# Patient Record
Sex: Female | Born: 1990 | Race: White | Hispanic: No | Marital: Single | Smoking: Former smoker
Health system: Southern US, Community
[De-identification: ages and names within clinical notes are randomized; demographics above are authoritative.]

## PROBLEM LIST (undated history)

## (undated) DIAGNOSIS — L659 Nonscarring hair loss, unspecified: Secondary | ICD-10-CM

---

## 2010-08-01 ENCOUNTER — Emergency Department (HOSPITAL_COMMUNITY): Payer: No Typology Code available for payment source

## 2010-08-01 ENCOUNTER — Emergency Department (HOSPITAL_COMMUNITY)
Admission: EM | Admit: 2010-08-01 | Discharge: 2010-08-01 | Disposition: A | Discharge status: Home / Self Care | Payer: No Typology Code available for payment source | Attending: Emergency Medicine | Admitting: Emergency Medicine

## 2010-08-01 DIAGNOSIS — M545 Low back pain, unspecified: Secondary | ICD-10-CM | POA: Insufficient documentation

## 2010-08-01 DIAGNOSIS — R109 Unspecified abdominal pain: Secondary | ICD-10-CM | POA: Insufficient documentation

## 2010-08-01 DIAGNOSIS — N949 Unspecified condition associated with female genital organs and menstrual cycle: Secondary | ICD-10-CM | POA: Insufficient documentation

## 2010-08-01 DIAGNOSIS — N938 Other specified abnormal uterine and vaginal bleeding: Secondary | ICD-10-CM | POA: Insufficient documentation

## 2010-08-01 DIAGNOSIS — L659 Nonscarring hair loss, unspecified: Secondary | ICD-10-CM | POA: Insufficient documentation

## 2010-08-01 LAB — URINALYSIS, ROUTINE W REFLEX MICROSCOPIC
Bilirubin Urine: NEGATIVE
Glucose, UA: NEGATIVE mg/dL
Ketones, ur: NEGATIVE mg/dL
Protein, ur: NEGATIVE mg/dL
Urobilinogen, UA: 0.2 mg/dL (ref 0.0–1.0)

## 2010-08-01 LAB — DIFFERENTIAL
Basophils Relative: 0 % (ref 0–1)
Lymphocytes Relative: 17 % (ref 12–46)
Monocytes Absolute: 0.7 10*3/uL (ref 0.1–1.0)
Monocytes Relative: 6 % (ref 3–12)
Neutro Abs: 8.7 10*3/uL — ABNORMAL HIGH (ref 1.7–7.7)
Neutrophils Relative %: 76 % (ref 43–77)

## 2010-08-01 LAB — CBC
HCT: 43 % (ref 36.0–46.0)
Hemoglobin: 14.6 g/dL (ref 12.0–15.0)
MCH: 27.5 pg (ref 26.0–34.0)
MCHC: 34 g/dL (ref 30.0–36.0)
RBC: 5.3 MIL/uL — ABNORMAL HIGH (ref 3.87–5.11)

## 2010-08-01 LAB — URINE MICROSCOPIC-ADD ON

## 2012-04-30 IMAGING — US US PELVIS COMPLETE
1 series · 14 of 25 positions shown · non-contrast
Comparison: None.

CLINICAL DATA: Pelvic pain, evaluate for fibroids or endometriosis.



[Series 1: us pelvis complete · 0.21mm/px · 14 of 62 slices shown]
[im 1/62]
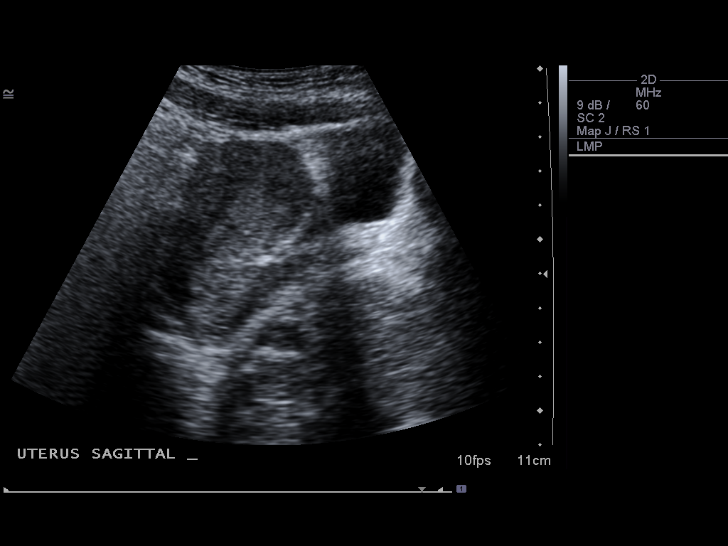
[im 6/62]
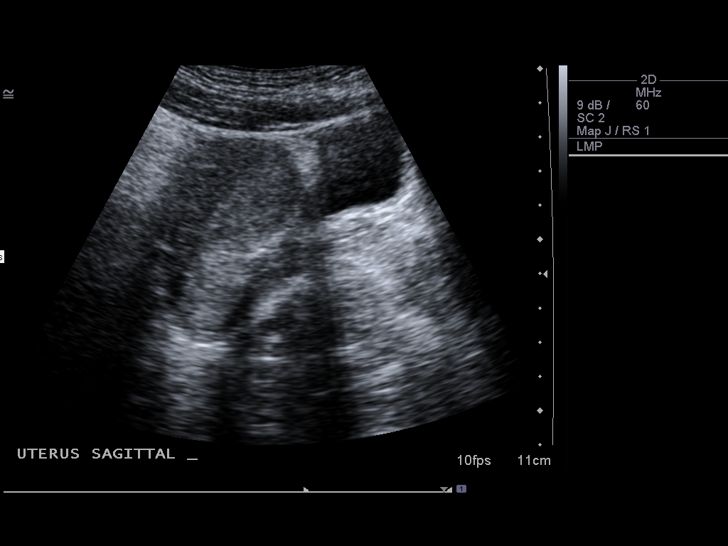
[im 11/62]
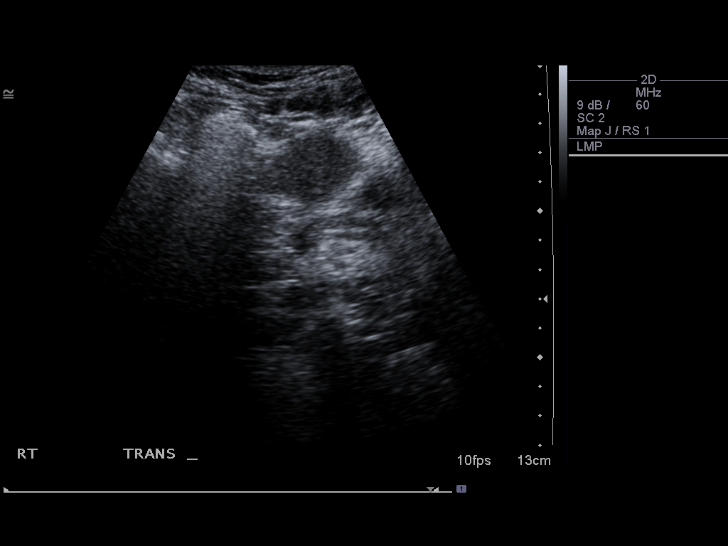
[im 16/62]
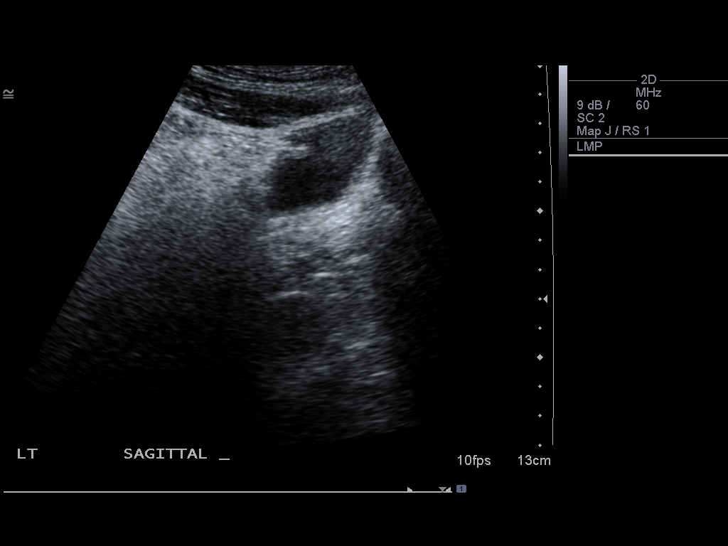
[im 21/62]
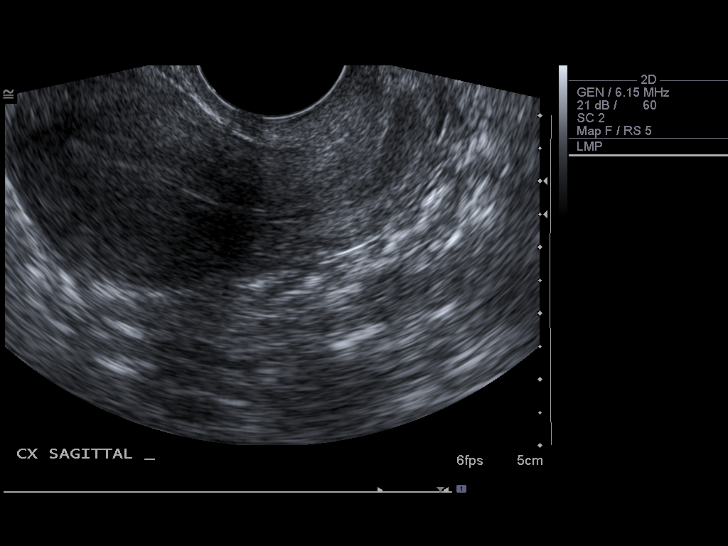
[im 23/62]
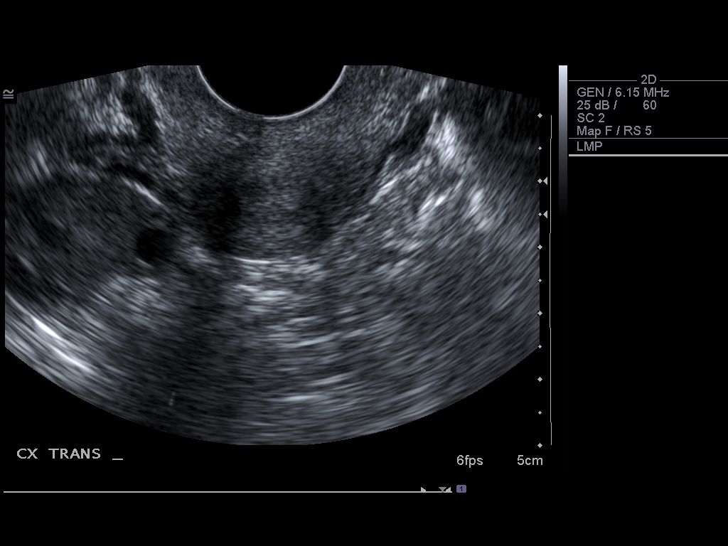
[im 28/62]
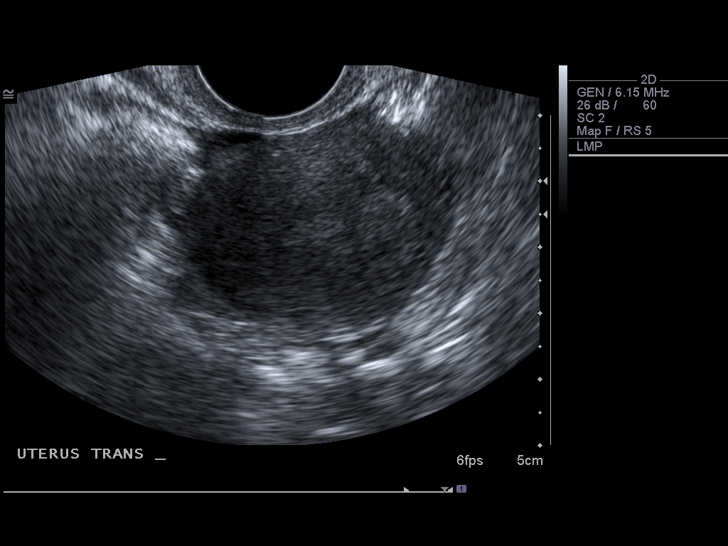
[im 34/62]
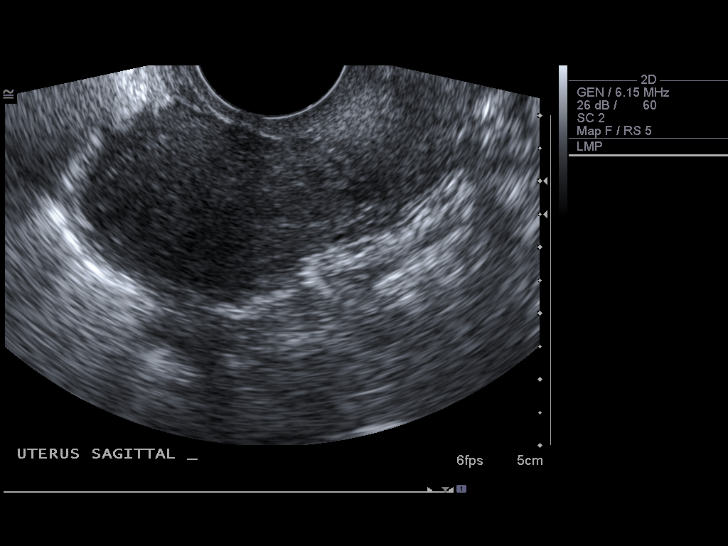
[im 39/62]
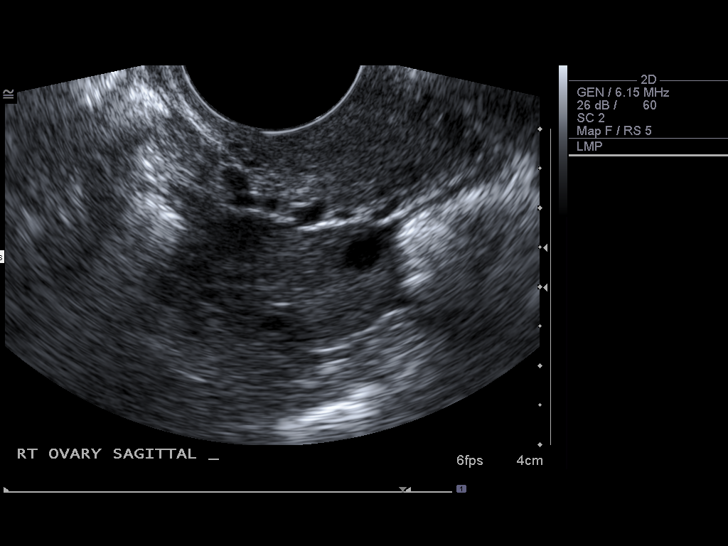
[im 41/62]
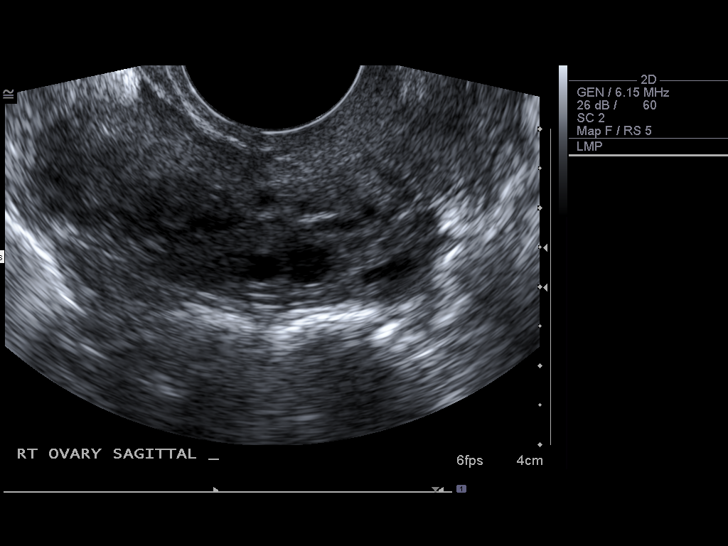
[im 46/62]
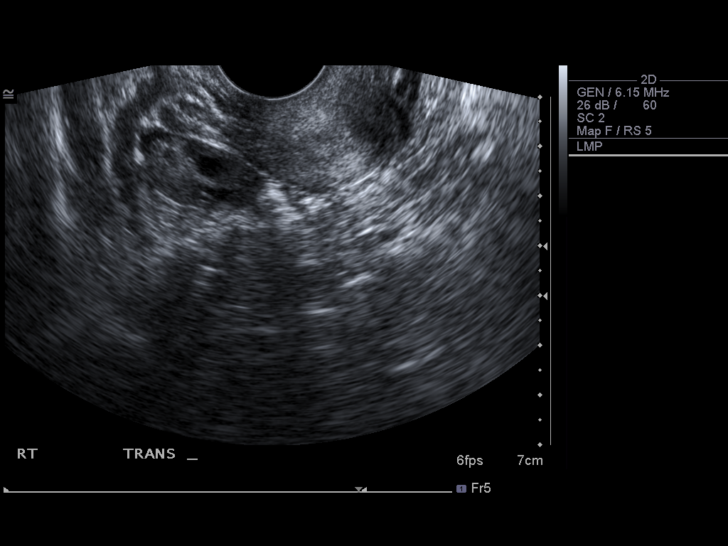
[im 51/62]
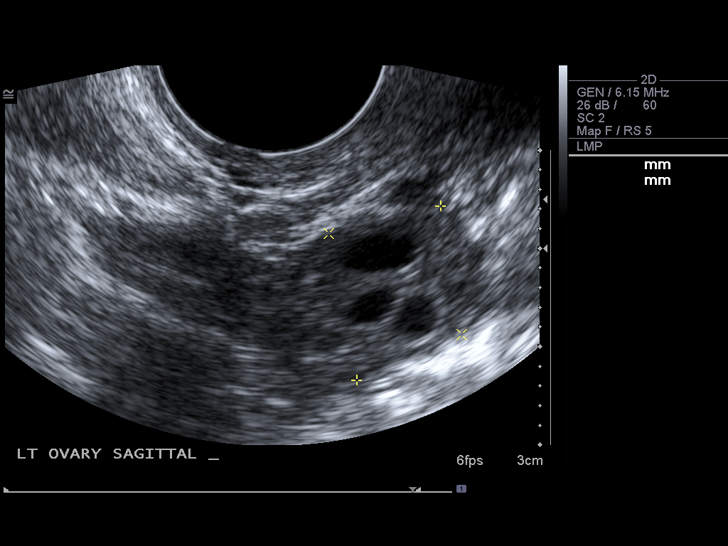
[im 56/62]
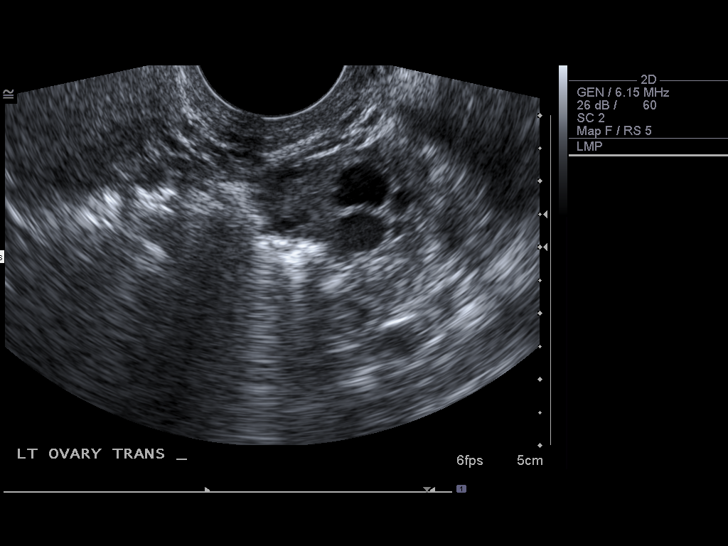
[im 62/62]
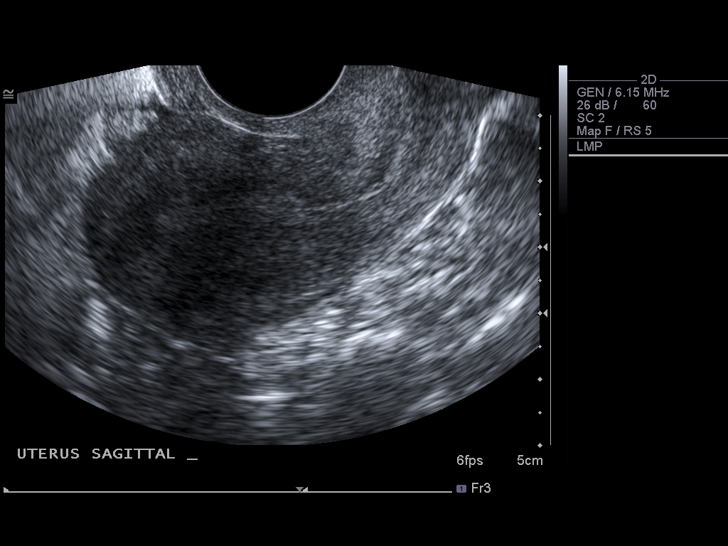

[14 of 25 positions shown; findings below may reference images not displayed]

FINDINGS: Uterus:  The uterus measures 6.2 x 3.3 x 4.3 cm.  No fibroids or
other uterine masses identified.

Endometrium:  The endometrium measures 4.5 mm.  Normal in
appearance.

Right ovary:  3.1 x 1.5 x 1.7 cm.  Normal appearance/no adnexal
mass.

Left ovary:  2.0 x 1.7 x 1.6 cm.  Normal appearance/no adnexal
mass.

Other findings:  A trace amount of free fluid is noted within the
pelvis.
IMPRESSION: Normal study.  No evidence of pelvic mass or other significant
abnormality.

## 2012-10-12 ENCOUNTER — Emergency Department (HOSPITAL_COMMUNITY)
Admission: EM | Admit: 2012-10-12 | Discharge: 2012-10-12 | Disposition: A | Discharge status: Home / Self Care | Payer: No Typology Code available for payment source | Attending: Emergency Medicine | Admitting: Emergency Medicine

## 2012-10-12 ENCOUNTER — Encounter (HOSPITAL_COMMUNITY): Payer: Self-pay | Admitting: Emergency Medicine

## 2012-10-12 DIAGNOSIS — B029 Zoster without complications: Secondary | ICD-10-CM | POA: Insufficient documentation

## 2012-10-12 DIAGNOSIS — R110 Nausea: Secondary | ICD-10-CM | POA: Insufficient documentation

## 2012-10-12 DIAGNOSIS — Z87891 Personal history of nicotine dependence: Secondary | ICD-10-CM | POA: Insufficient documentation

## 2012-10-12 DIAGNOSIS — Z872 Personal history of diseases of the skin and subcutaneous tissue: Secondary | ICD-10-CM | POA: Insufficient documentation

## 2012-10-12 HISTORY — DX: Nonscarring hair loss, unspecified: L65.9

## 2012-10-12 MED ORDER — ACYCLOVIR 400 MG PO TABS: 400.0000 mg | ORAL_TABLET | Freq: Four times a day (QID) | ORAL | Status: AC

## 2012-10-12 MED ORDER — OXYCODONE-ACETAMINOPHEN 5-325 MG PO TABS: 1.0000 | ORAL_TABLET | Freq: Four times a day (QID) | ORAL | Status: AC | PRN

## 2012-10-12 NOTE — ED Notes (Signed)
PT. REPORTS ITCHY/PAINFUL RASH AT RIGHT LOWER BACK FOR 3 DAYS .

## 2012-10-12 NOTE — ED Provider Notes (Signed)
History    This chart was scribed for non-physician practitioner Magnus Sinning, PA-C working with Doug Sou, MD by Toya Smothers, ED Scribe. This patient was seen in room TR08C/TR08C and the patient's care was started at 9:56 PM.   CSN: [id number]  Arrival date & time 10/12/12  1932   First MD Initiated Contact with Patient 10/12/12 2041      Chief Complaint  Patient presents with  . Rash    Patient is a 22 y.o. female presenting with rash. The history is provided by the patient. No language interpreter was used.  Rash Associated symptoms: nausea     HPI Comments: April Mcknight is a 22 y.o. female with h/o alopecia, who presents to the Emergency Department complaining of 3 days of new, sudden onset, constant rash to the left lower back.  Rash is painful.  No drainage from the rash.  Pt denies exposure to new chemicals, foods, or detergents. Denies new medications.  Symptoms have not been treated PTA. Pt denies headache, diaphoresis, fever, chills, nausea, vomiting, diarrhea, weakness, cough, SOB and any other pain. Pt denies use of tobacco, alcohol, and illicit drug use.     Past Medical History  Diagnosis Date  . Alopecia     History reviewed. No pertinent past surgical history.  No family history on file.  History  Substance Use Topics  . Smoking status: Former Games developer  . Smokeless tobacco: Not on file  . Alcohol Use: No     Review of Systems  Gastrointestinal: Positive for nausea.  Skin: Positive for rash.  All other systems reviewed and are negative.    Allergies  Other  Home Medications   Current Outpatient Rx  Name  Route  Sig  Dispense  Refill  . Ibuprofen (IBU PO)   Oral   Take 2 tablets by mouth every 6 (six) hours as needed (pain).           BP 133/75  Pulse 90  Temp(Src) 98.1 F (36.7 C) (Oral)  Resp 16  SpO2 100%  LMP 09/24/2012  Physical Exam  Nursing note and vitals reviewed. Constitutional: She is oriented to person,  place, and time. She appears well-developed and well-nourished. No distress.  HENT:  Head: Normocephalic and atraumatic.  Eyes: EOM are normal.  Neck: Neck supple. No tracheal deviation present.  Cardiovascular: Normal rate.   Pulmonary/Chest: Effort normal. No respiratory distress.  Musculoskeletal: Normal range of motion.  Neurological: She is alert and oriented to person, place, and time.  Skin: Skin is warm and dry.  Erythematous vesicular rash located on the right side of the lower lumbar No lesions on the left side  Psychiatric: She has a normal mood and affect. Her behavior is normal.    ED Course  Procedures DIAGNOSTIC STUDIES: Oxygen Saturation is 100% on room air, normal by my interpretation.    COORDINATION OF CARE: 21:56- Evaluated Pt. Pt is awake, alert, and without distress. 22:01- Patient understands and agrees with initial ED impression and plan with expectations set for ED visit.   Labs Reviewed - No data to display No results found.   No diagnosis found.    MDM  Appearance of the painful rash consistent with Shingles.  Patient given prescription for Acycylovir and pain medication.  Patient instructed to follow up with PCP.  I personally performed the services described in this documentation, which was scribed in my presence. The recorded information has been reviewed and is accurate.    April Mcknight  [name], PA-C 10/13/12 1700

## 2012-10-14 NOTE — ED Provider Notes (Signed)
Medical screening examination/treatment/procedure(s) were performed by non-physician practitioner and as supervising physician I was immediately available for consultation/collaboration.  Leonard Hendler, MD 10/14/12 0133 

## 2014-06-24 ENCOUNTER — Emergency Department (HOSPITAL_COMMUNITY)
Admission: EM | Admit: 2014-06-24 | Discharge: 2014-06-24 | Disposition: A | Discharge status: Home / Self Care | Payer: No Typology Code available for payment source | Attending: Emergency Medicine | Admitting: Emergency Medicine

## 2014-06-24 ENCOUNTER — Encounter (HOSPITAL_COMMUNITY): Payer: Self-pay | Admitting: *Deleted

## 2014-06-24 DIAGNOSIS — R21 Rash and other nonspecific skin eruption: Secondary | ICD-10-CM | POA: Diagnosis present

## 2014-06-24 DIAGNOSIS — Z87891 Personal history of nicotine dependence: Secondary | ICD-10-CM | POA: Insufficient documentation

## 2014-06-24 DIAGNOSIS — Z79899 Other long term (current) drug therapy: Secondary | ICD-10-CM | POA: Insufficient documentation

## 2014-06-24 DIAGNOSIS — L509 Urticaria, unspecified: Secondary | ICD-10-CM | POA: Insufficient documentation

## 2014-06-24 MED ORDER — DIPHENHYDRAMINE HCL 25 MG PO CAPS
50.0000 mg | ORAL_CAPSULE | Freq: Once | ORAL | Status: AC
  Administered 2014-06-24: 50 mg via ORAL
  Filled 2014-06-24: qty 2

## 2014-06-24 MED ORDER — DIPHENHYDRAMINE HCL 25 MG PO TABS: 25.0000 mg | ORAL_TABLET | ORAL | Status: AC | PRN

## 2014-06-24 NOTE — Discharge Instructions (Signed)
Avoid soaps and detergents with dye and perfume. Take Benadryl as needed for itching. Return immediately for oral swelling, difficulty breathing, wheezing, persistent vomiting or for any concerns.  Hives Hives are itchy, red, swollen areas of the skin. They can vary in size and location on your body. Hives can come and go for hours or several days (acute hives) or for several weeks (chronic hives). Hives do not spread from person to person (noncontagious). They may get worse with scratching, exercise, and emotional stress. CAUSES   Allergic reaction to food, additives, or drugs.  Infections, including the common cold.  Illness, such as vasculitis, lupus, or thyroid disease.  Exposure to sunlight, heat, or cold.  Exercise.  Stress.  Contact with chemicals. SYMPTOMS   Red or white swollen patches on the skin. The patches may change size, shape, and location quickly and repeatedly.  Itching.  Swelling of the hands, feet, and face. This may occur if hives develop deeper in the skin. DIAGNOSIS  Your caregiver can usually tell what is wrong by performing a physical exam. Skin or blood tests may also be done to determine the cause of your hives. In some cases, the cause cannot be determined. TREATMENT  Mild cases usually get better with medicines such as antihistamines. Severe cases may require an emergency epinephrine injection. If the cause of your hives is known, treatment includes avoiding that trigger.  HOME CARE INSTRUCTIONS   Avoid causes that trigger your hives.  Take antihistamines as directed by your caregiver to reduce the severity of your hives. Non-sedating or low-sedating antihistamines are usually recommended. Do not drive while taking an antihistamine.  Take any other medicines prescribed for itching as directed by your caregiver.  Wear loose-fitting clothing.  Keep all follow-up appointments as directed by your caregiver. SEEK MEDICAL CARE IF:   You have  persistent or severe itching that is not relieved with medicine.  You have painful or swollen joints. SEEK IMMEDIATE MEDICAL CARE IF:   You have a fever.  Your tongue or lips are swollen.  You have trouble breathing or swallowing.  You feel tightness in the throat or chest.  You have abdominal pain. These problems may be the first sign of a life-threatening allergic reaction. Call your local emergency services (911 in [place] MAKE SURE YOU:   Understand these instructions.  Will watch your condition.  Will get help right away if you are not doing well or get worse. Document Released: 04/18/2005 Document Revised: 04/23/2013 Document Reviewed: 07/12/2011 [facility] Patient Information 2015 [place], [place]. This information is not intended to replace advice given to you by your health care provider. Make sure you discuss any questions you have with your health care provider.

## 2014-06-24 NOTE — ED Notes (Signed)
Patient presents with rash on ext and trunk.  States no new foods, soaps, etc

## 2014-06-24 NOTE — ED Provider Notes (Signed)
CSN: [id number]     Arrival date & time 06/24/14  0157 History   First MD Initiated Contact with Patient 06/24/14 [phone]     Chief Complaint  Patient presents with  . Rash     (Consider location/radiation/quality/duration/timing/severity/associated sxs/prior Treatment) HPI Patient with raised erythematous rash to legs and trunk starting around 8 PM last night. Denies any new medication. States she started using Ax body wash several days ago. She has a history of sensitive skin. No oral swelling. No respiratory difficulty. No nausea or vomiting. Patient has had no medication this point. No family members with similar rashes. Past Medical History  Diagnosis Date  . Alopecia    History reviewed. No pertinent past surgical history. No family history on file. History  Substance Use Topics  . Smoking status: Former Games developermoker  . Smokeless tobacco: Never Used  . Alcohol Use: No   OB History    Gravida Para Term Preterm AB TAB SAB Ectopic Multiple Living   1              Review of Systems  Constitutional: Negative for fever and chills.  HENT: Negative for facial swelling.   Respiratory: Negative for chest tightness.   Cardiovascular: Negative for chest pain.  Gastrointestinal: Negative for nausea and vomiting.  Skin: Positive for rash.  Neurological: Negative for dizziness, weakness, light-headedness, numbness and headaches.  All other systems reviewed and are negative.     Allergies  Other  Home Medications   Prior to Admission medications   Medication Sig Start Date End Date Taking? Authorizing Provider  acyclovir (ZOVIRAX) 400 MG tablet Take 1 tablet (400 mg total) by mouth 4 (four) times daily. 10/12/12   Heather Laisure, PA-C  diphenhydrAMINE (BENADRYL) 25 MG tablet Take 1 tablet (25 mg total) by mouth every 4 (four) hours as needed for itching. 06/24/14   Loren Raceravid Momoko Slezak, MD  Ibuprofen (IBU PO) Take 2 tablets by mouth every 6 (six) hours as needed (pain).    Historical  Provider, MD  oxyCODONE-acetaminophen (PERCOCET/ROXICET) 5-325 MG per tablet Take 1-2 tablets by mouth every 6 (six) hours as needed for pain. 10/12/12   Heather Laisure, PA-C   BP 115/7 mmHg  Pulse 81  Temp(Src) 97.6 F (36.4 C) (Oral)  Resp 17  Ht 5\' 2"  (1.575 m)  Wt 135 lb (61.236 kg)  BMI 24.69 kg/m2  SpO2 96% Physical Exam  Constitutional: She is oriented to person, place, and time. She appears well-developed and well-nourished. No distress.  HENT:  Head: Normocephalic and atraumatic.  Mouth/Throat: Oropharynx is clear and moist. No oropharyngeal exudate.  No intraoral swelling.  Eyes: EOM are normal. Pupils are equal, round, and reactive to light.  Neck: Normal range of motion. Neck supple.  Cardiovascular: Normal rate and regular rhythm.  Exam reveals no gallop and no friction rub.   No murmur heard. Pulmonary/Chest: Effort normal and breath sounds normal. No stridor. No respiratory distress. She has no wheezes. She has no rales.  Abdominal: Soft. Bowel sounds are normal. She exhibits no distension and no mass. There is no tenderness. There is no rebound and no guarding.  Musculoskeletal: Normal range of motion. She exhibits no edema or tenderness.  Neurological: She is alert and oriented to person, place, and time.  Skin: Skin is warm and dry. Rash noted. No erythema.  Patient with raised erythematous plaques to the proximal thighs and abdomen as well as shoulders. Consistent with urticaria.  Psychiatric: She has a normal mood and affect. Her  behavior is normal.  Nursing note and vitals reviewed.   ED Course  Procedures (including critical care time) Labs Review Labs Reviewed - No data to display  Imaging Review No results found.   EKG Interpretation None      MDM   Final diagnoses:  Hives    Have advised hypoallergenic soaps and detergents. We'll treat with Benadryl and reassess. Anticipate discharge home.  Improved rash and itching. We'll discharge home.  Return precautions given.  Loren Racer, MD 06/24/14 [phone]
